# Patient Record
Sex: Female | Born: 1975 | Hispanic: Yes | Smoking: Never smoker
Health system: Southern US, Community
[De-identification: ages and names within clinical notes are randomized; demographics above are authoritative.]

---

## 2010-05-31 ENCOUNTER — Ambulatory Visit (HOSPITAL_COMMUNITY): Admission: RE | Admit: 2010-05-31 | Discharge: 2010-05-31 | Payer: Self-pay | Admitting: Obstetrics

## 2010-06-07 ENCOUNTER — Ambulatory Visit (HOSPITAL_COMMUNITY): Admission: RE | Admit: 2010-06-07 | Discharge: 2010-06-07 | Payer: Self-pay | Admitting: Obstetrics

## 2010-06-12 ENCOUNTER — Ambulatory Visit (HOSPITAL_COMMUNITY): Admission: RE | Admit: 2010-06-12 | Discharge: 2010-06-12 | Payer: Self-pay | Admitting: Obstetrics

## 2010-07-05 ENCOUNTER — Ambulatory Visit (HOSPITAL_COMMUNITY)
Admission: RE | Admit: 2010-07-05 | Discharge: 2010-07-05 | Payer: Self-pay | Source: Home / Self Care | Admitting: Obstetrics

## 2010-07-17 ENCOUNTER — Ambulatory Visit (HOSPITAL_COMMUNITY)
Admission: RE | Admit: 2010-07-17 | Discharge: 2010-07-17 | Payer: Self-pay | Source: Home / Self Care | Admitting: Obstetrics

## 2010-07-20 ENCOUNTER — Ambulatory Visit (HOSPITAL_COMMUNITY)
Admission: RE | Admit: 2010-07-20 | Discharge: 2010-07-20 | Payer: Self-pay | Source: Home / Self Care | Admitting: Obstetrics

## 2010-07-24 ENCOUNTER — Ambulatory Visit (HOSPITAL_COMMUNITY)
Admission: RE | Admit: 2010-07-24 | Discharge: 2010-07-24 | Payer: Self-pay | Source: Home / Self Care | Attending: Obstetrics | Admitting: Obstetrics

## 2010-07-26 ENCOUNTER — Ambulatory Visit (HOSPITAL_COMMUNITY)
Admission: RE | Admit: 2010-07-26 | Discharge: 2010-07-26 | Payer: Self-pay | Source: Home / Self Care | Attending: Obstetrics | Admitting: Obstetrics

## 2010-07-30 ENCOUNTER — Ambulatory Visit (HOSPITAL_COMMUNITY)
Admission: RE | Admit: 2010-07-30 | Discharge: 2010-07-30 | Payer: Self-pay | Source: Home / Self Care | Attending: Obstetrics | Admitting: Obstetrics

## 2010-08-02 ENCOUNTER — Encounter
Admission: RE | Admit: 2010-08-02 | Discharge: 2010-09-18 | Payer: Self-pay | Source: Home / Self Care | Attending: Obstetrics | Admitting: Obstetrics

## 2010-08-02 ENCOUNTER — Ambulatory Visit (HOSPITAL_COMMUNITY)
Admission: RE | Admit: 2010-08-02 | Discharge: 2010-08-02 | Payer: Self-pay | Source: Home / Self Care | Attending: Obstetrics | Admitting: Obstetrics

## 2010-08-07 ENCOUNTER — Ambulatory Visit (HOSPITAL_COMMUNITY)
Admission: RE | Admit: 2010-08-07 | Discharge: 2010-08-07 | Payer: Self-pay | Source: Home / Self Care | Attending: Obstetrics | Admitting: Obstetrics

## 2010-08-09 ENCOUNTER — Inpatient Hospital Stay (HOSPITAL_COMMUNITY)
Admission: RE | Admit: 2010-08-09 | Discharge: 2010-08-11 | Payer: Self-pay | Source: Home / Self Care | Attending: Obstetrics | Admitting: Obstetrics

## 2010-09-08 ENCOUNTER — Encounter: Payer: Self-pay | Admitting: Obstetrics

## 2010-10-29 LAB — GLUCOSE, CAPILLARY
Glucose-Capillary: 114 mg/dL — ABNORMAL HIGH (ref 70–99)
Glucose-Capillary: 82 mg/dL (ref 70–99)
Glucose-Capillary: 94 mg/dL (ref 70–99)
Glucose-Capillary: 97 mg/dL (ref 70–99)

## 2010-10-29 LAB — CBC
HCT: 30.2 % — ABNORMAL LOW (ref 36.0–46.0)
HCT: 34.3 % — ABNORMAL LOW (ref 36.0–46.0)
Hemoglobin: 11 g/dL — ABNORMAL LOW (ref 12.0–15.0)
MCH: 24.1 pg — ABNORMAL LOW (ref 26.0–34.0)
MCHC: 32.1 g/dL (ref 30.0–36.0)
MCV: 75.1 fL — ABNORMAL LOW (ref 78.0–100.0)
RBC: 4.02 MIL/uL (ref 3.87–5.11)
RBC: 4.56 MIL/uL (ref 3.87–5.11)
WBC: 9.2 10*3/uL (ref 4.0–10.5)

## 2010-10-29 LAB — RAPID HIV SCREEN (WH-MAU): Rapid HIV Screen: NONREACTIVE

## 2010-10-29 LAB — RPR: RPR Ser Ql: NONREACTIVE

## 2011-03-30 IMAGING — US US OB DETAIL+14 WK
1 series · 18 of 28 positions shown · non-contrast
Comparison: none

OBSTETRICAL ULTRASOUND:
 This ultrasound was performed in The [HOSPITAL], and the [facility] report will be stored to [REDACTED] PACS.  This report is also available in [HOSPITAL]?s accessANYware.

[Series 1: us ob detail+14 wk · 18 of 64 slices shown]
[im 1/64]
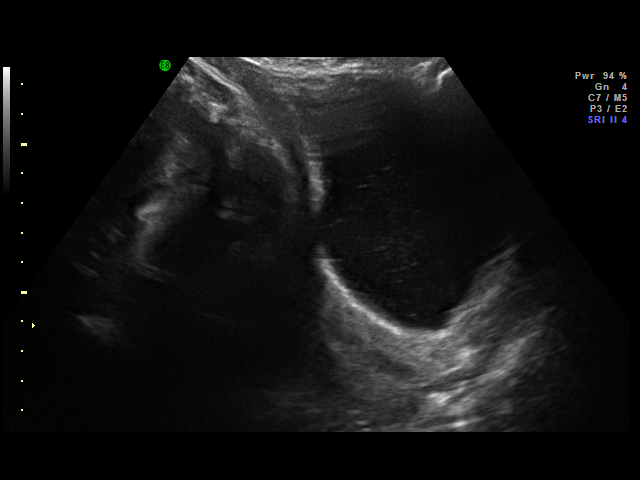
[im 5/64]
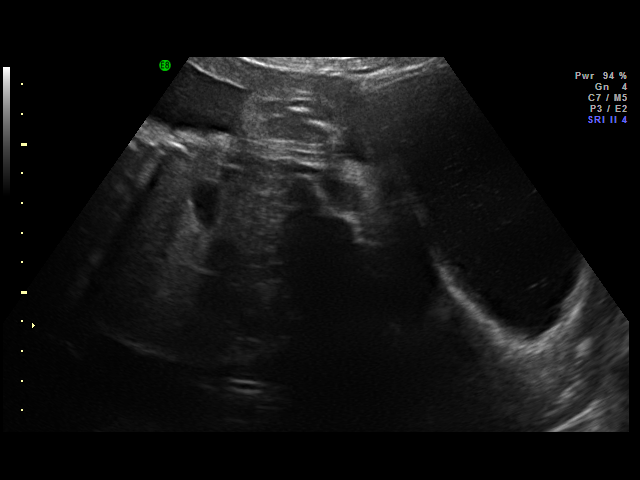
[im 8/64]
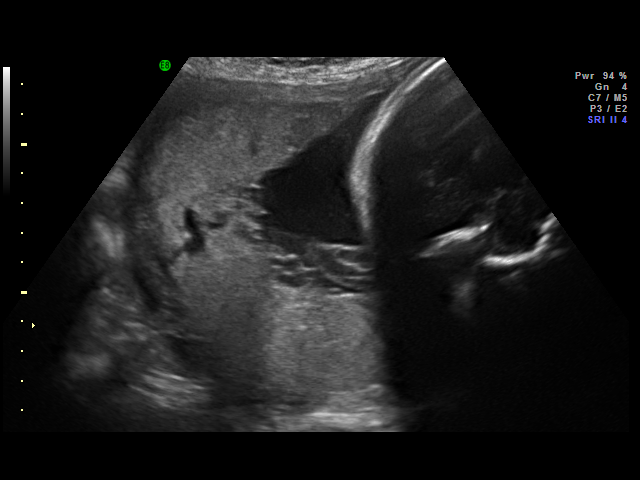
[im 12/64]
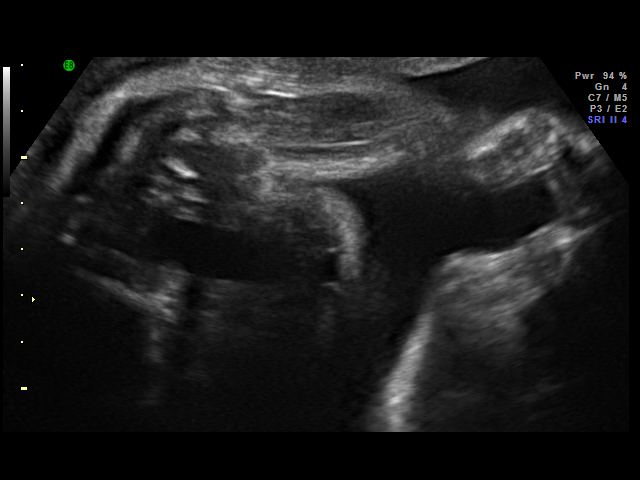
[im 17/64]
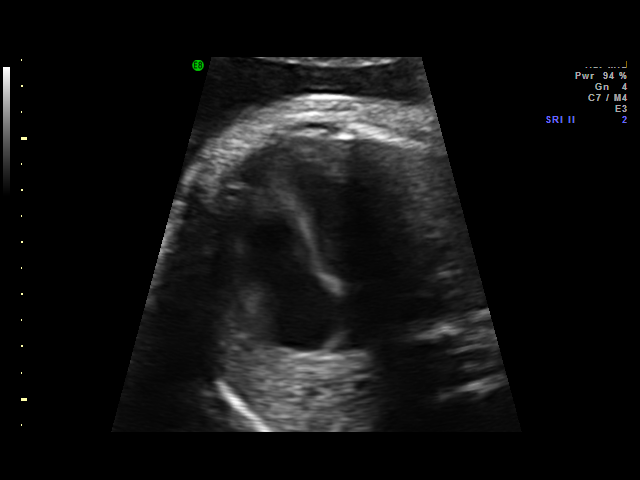
[im 19/64]
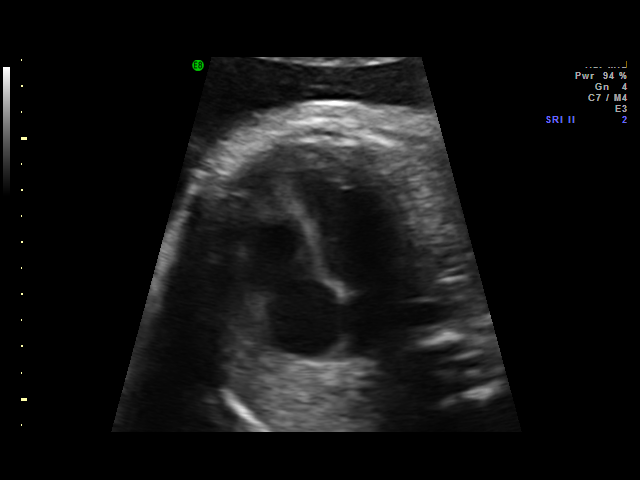
[im 24/64]
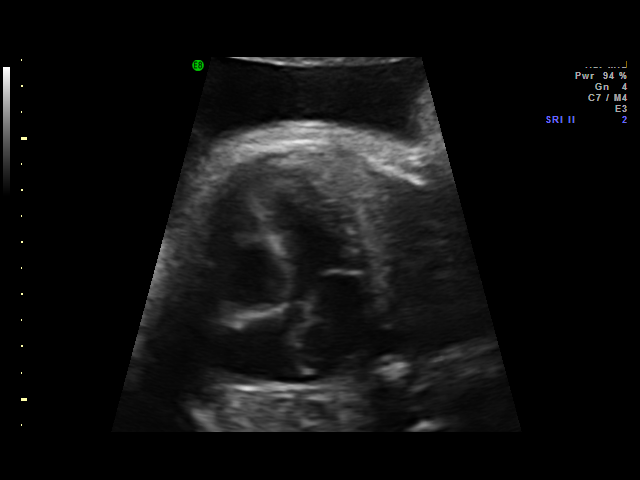
[im 26/64]
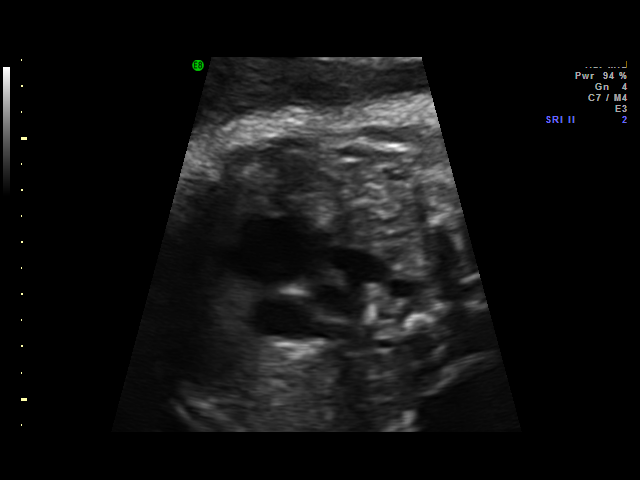
[im 31/64]
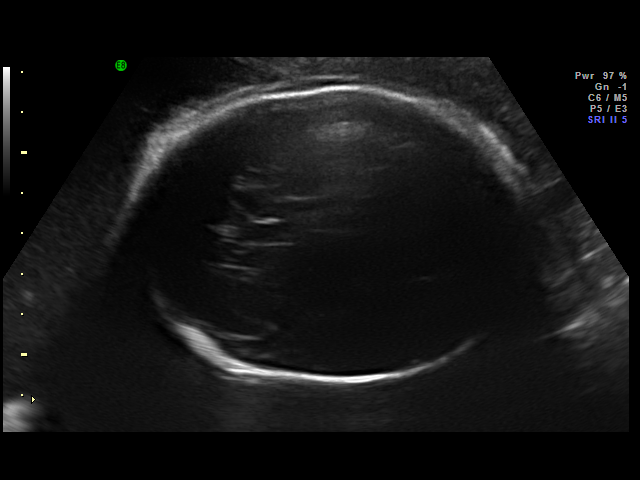
[im 33/64]
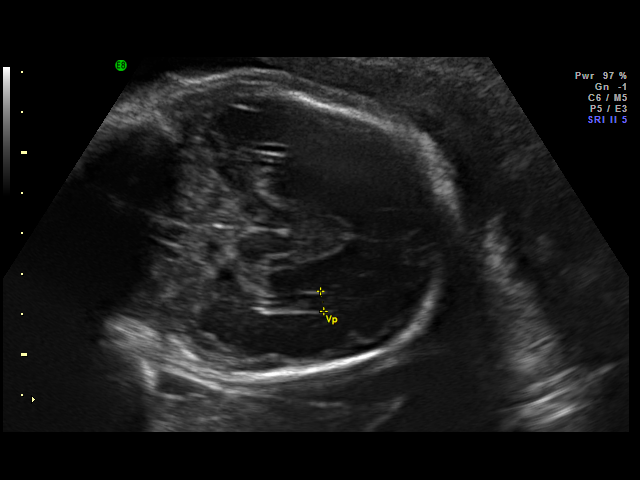
[im 38/64]
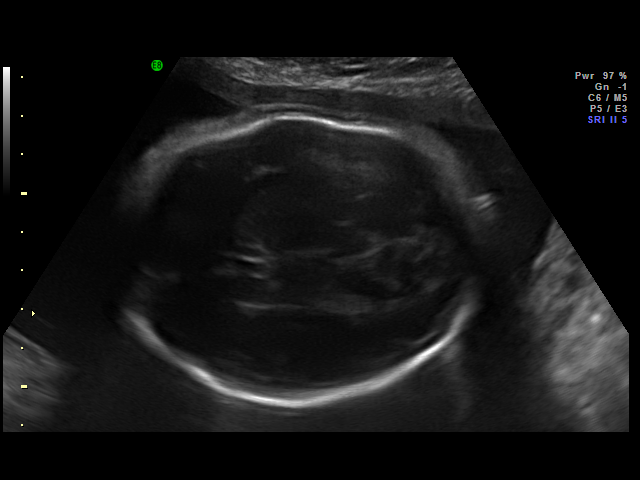
[im 40/64]
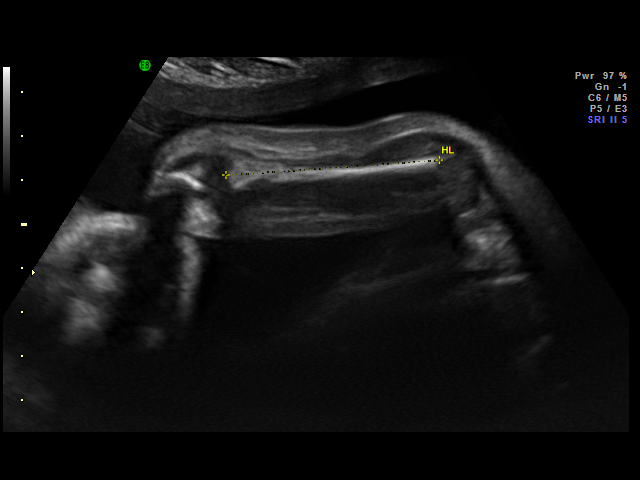
[im 45/64]
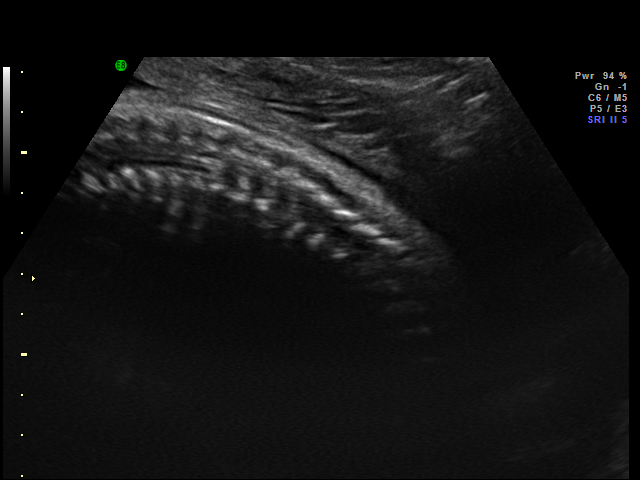
[im 50/64]
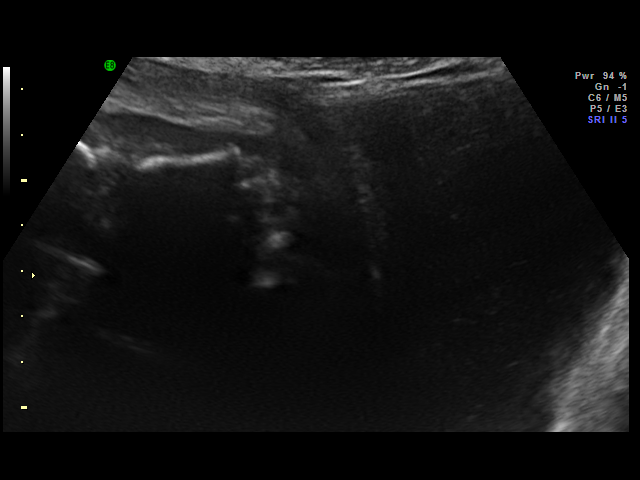
[im 52/64]
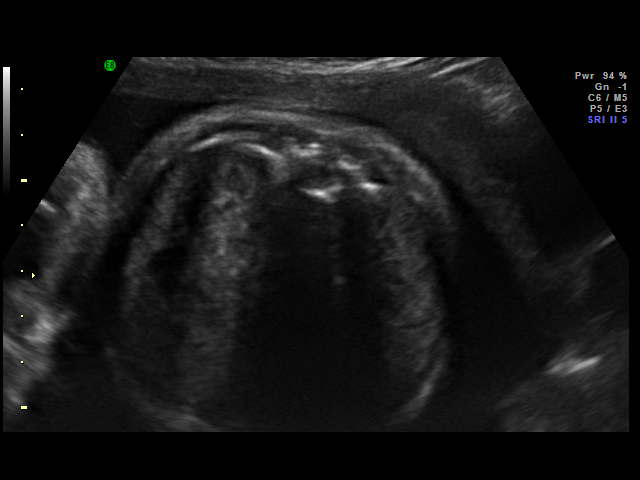
[im 57/64]
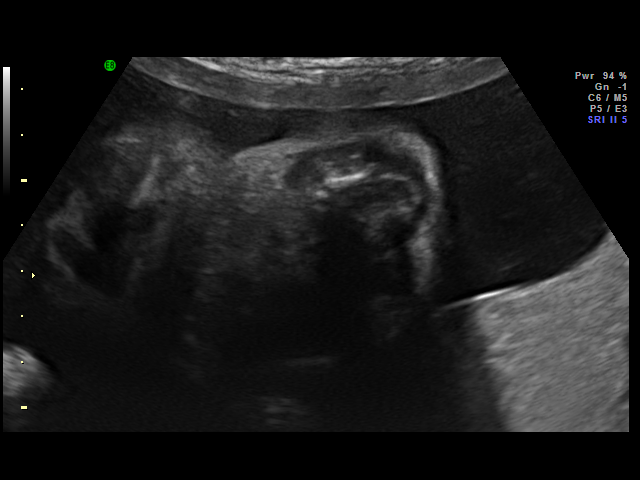
[im 59/64]
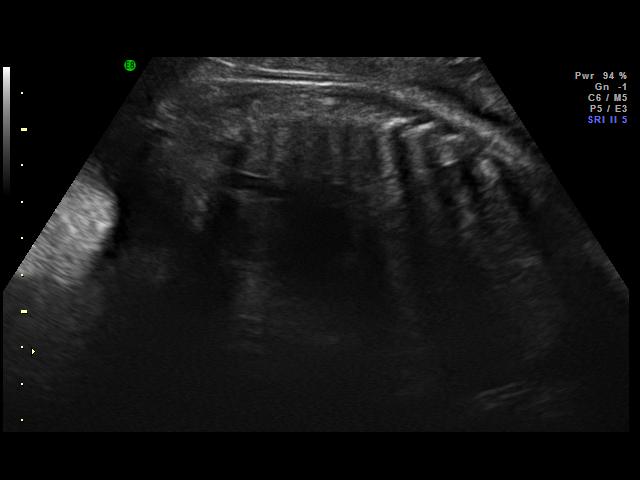
[im 64/64]
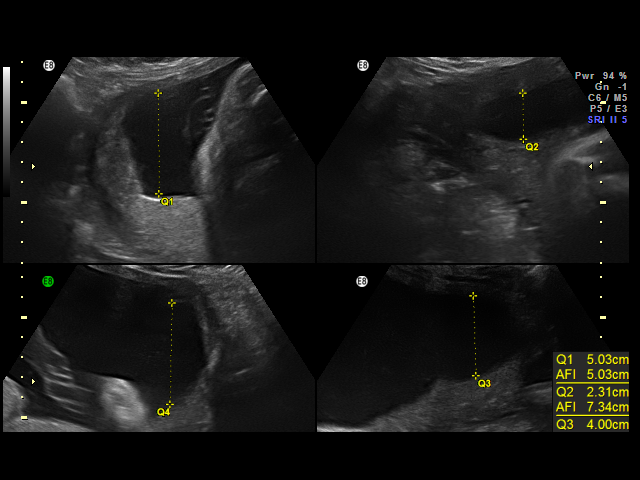

[18 of 28 positions shown; findings below may reference images not displayed]

IMPRESSION: [facility] has also been faxed to the ordering physician.

## 2011-06-01 IMAGING — US US OB FOLLOW-UP
1 series · 12 of 27 positions shown · non-contrast
Comparison: none

[Series 1: us ob follow up · 12 of 27 slices shown]
[im 2/27]
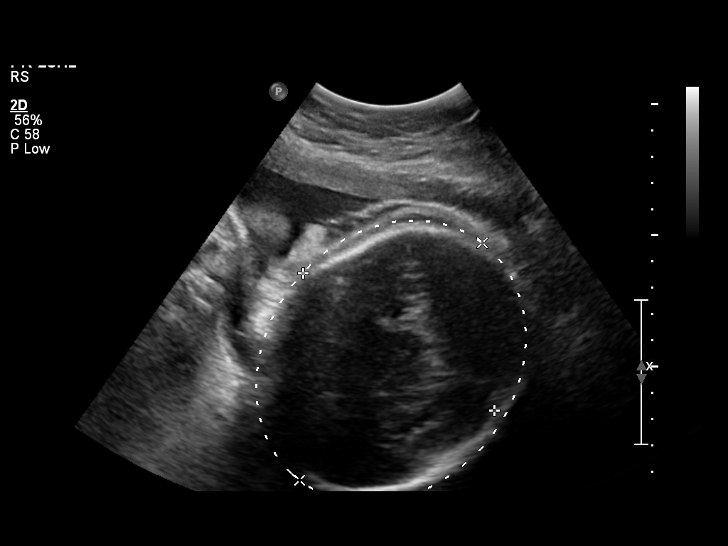
[im 4/27]
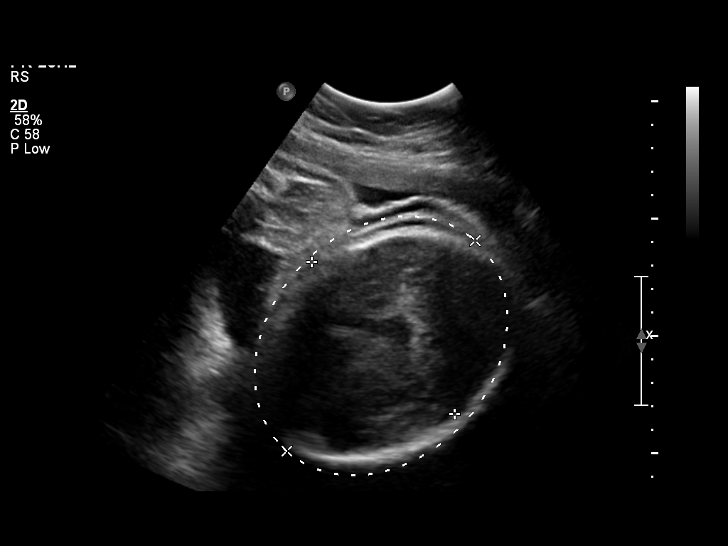
[im 6/27]
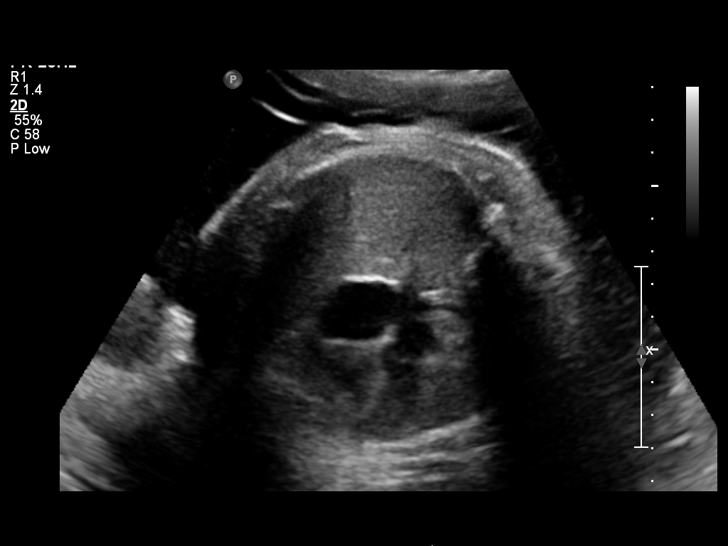
[im 8/27]
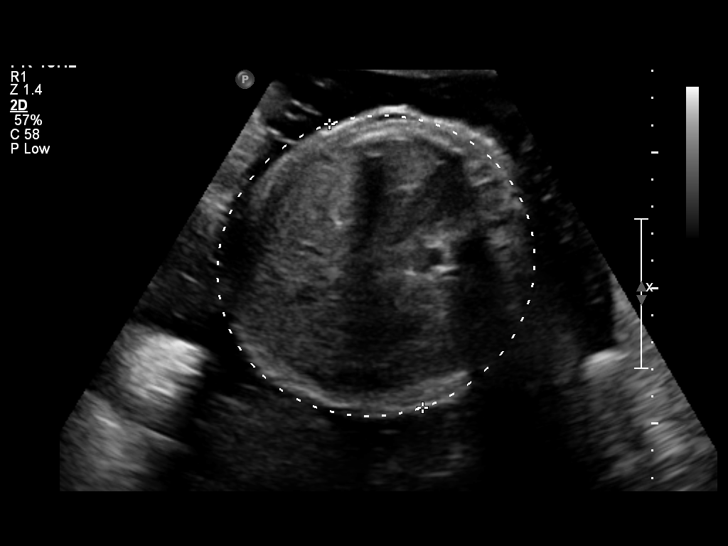
[im 11/27]
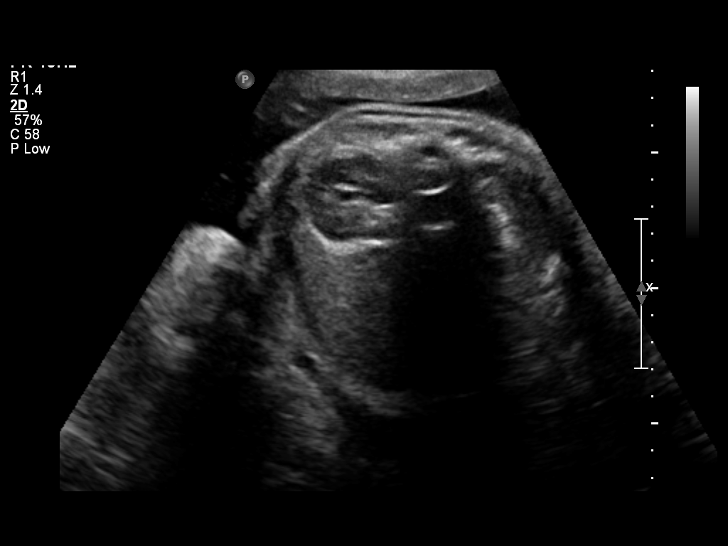
[im 13/27]
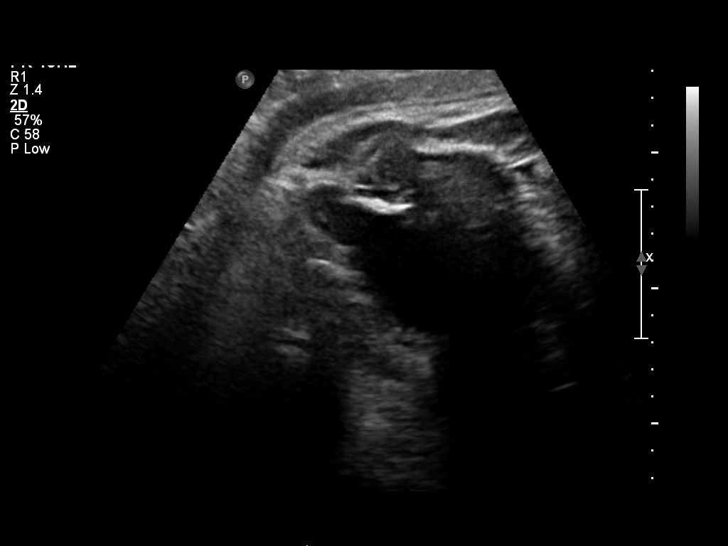
[im 15/27]
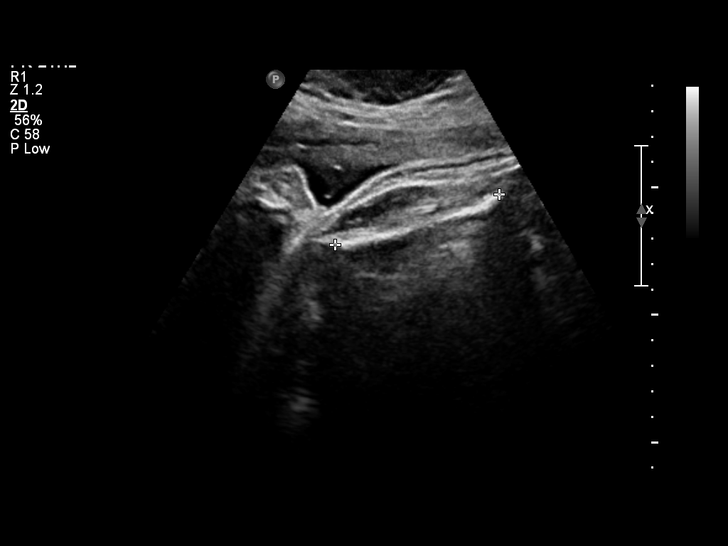
[im 17/27]
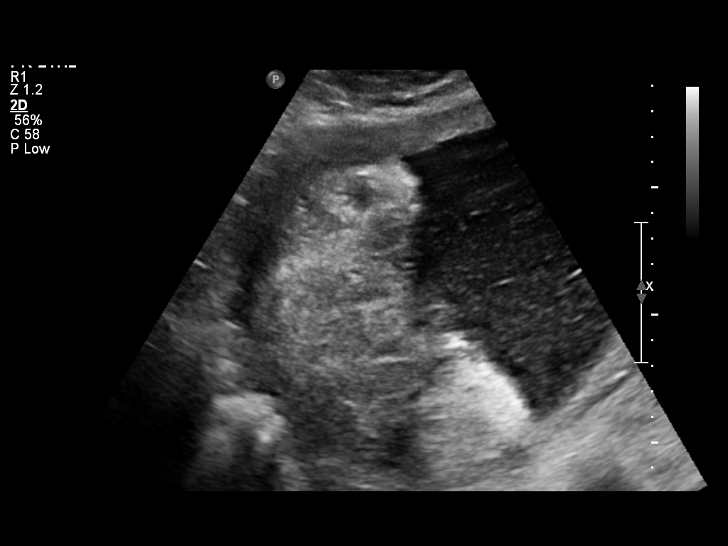
[im 20/27]
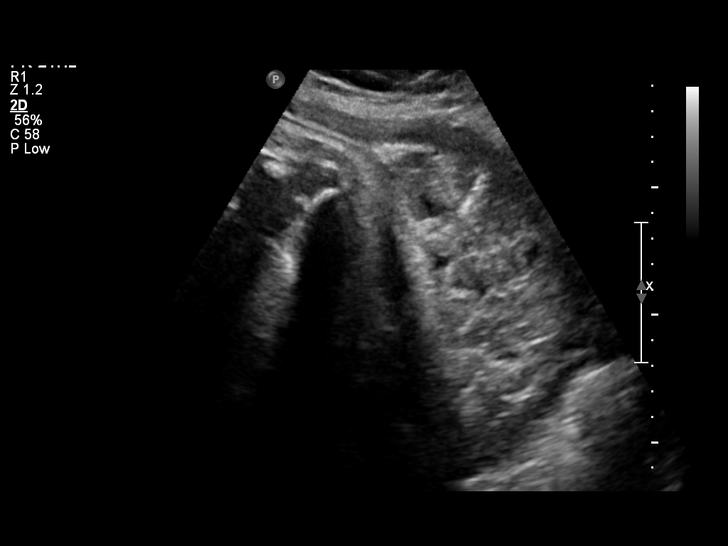
[im 22/27]
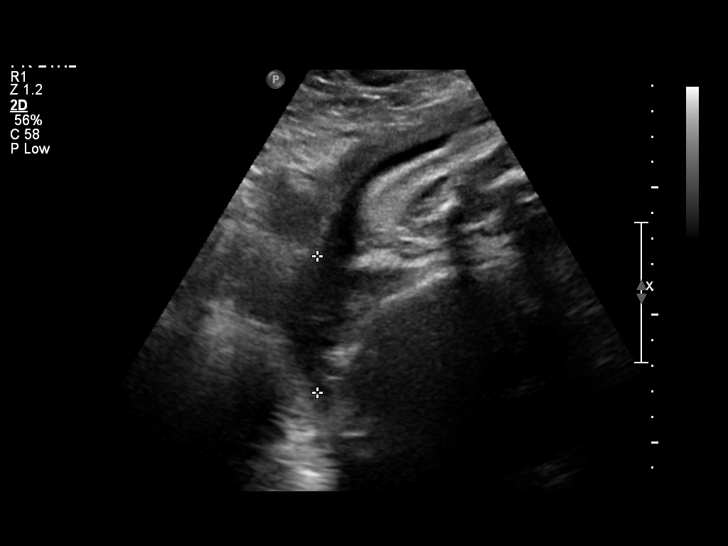
[im 24/27]
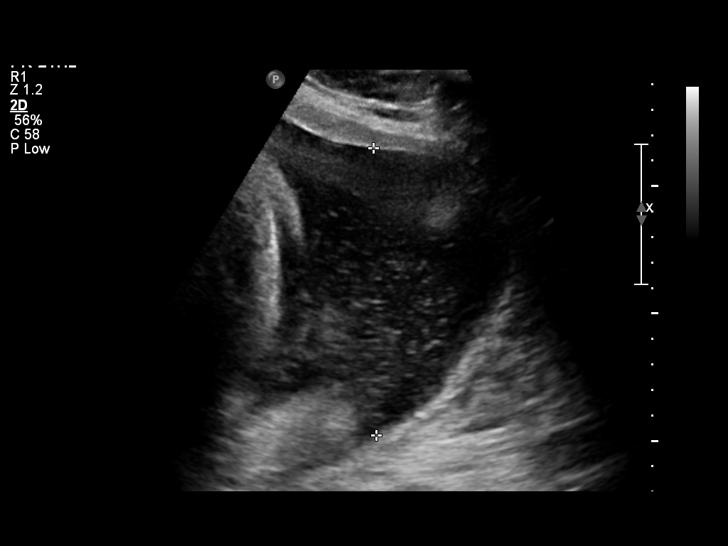
[im 26/27]
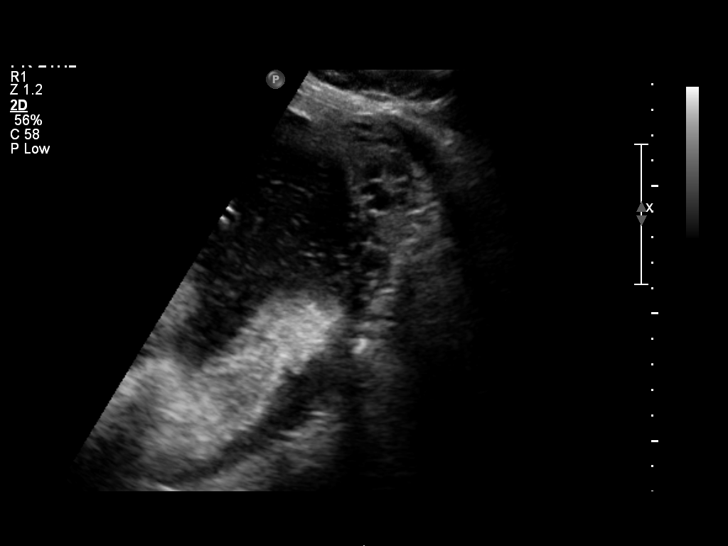

[12 of 27 positions shown; findings below may reference images not displayed]

OBSTETRICS REPORT
                      (Signed Final 08/09/2010 [DATE])

 Order#:         _I
Procedures

 US OB FOLLOW UP                                       76816.1
Indications

 Uncertain fetal presentation
 Assess Fetal Growth / Estimated Fetal Weight
 Unilateral fetal renal agenesis
 A2 Diabetes (currently treated with insulin)
 Mild polyhydramnios
 Increased BMI
 Late entry to prenatal care
 Prior macrosomic infant (10 lbs)
Fetal Evaluation

 Fetal Heart Rate:  135                          bpm
 Cardiac Activity:  Observed
 Presentation:      Cephalic
 Placenta:          Posterior, above cervical
                    os
 P. Cord            Previously Visualized
 Insertion:

 Amniotic Fluid
 AFI FV:      Subjectively increased
 AFI Sum:     20.96   cm       88  %Tile     Larg Pckt:  11.22   cm
 RUQ:   2.08    cm   RLQ:    11.22  cm    LUQ:   2.33    cm   LLQ:    5.33   cm
Biometry

 BPD:     89.9  mm     G. Age:  36w 3d                CI:        71.73   70 - 86
                                                      FL/HC:      20.5   20.6 -

 HC:     337.9  mm     G. Age:  38w 5d       33  %    HC/AC:      0.93   0.87 -

 AC:     362.2  mm     G. Age:  40w 1d       92  %    FL/BPD:     77.0   71 - 87
 FL:      69.2  mm     G. Age:  35w 4d      < 3  %    FL/AC:      19.1   20 - 24

 Est. FW:    6333  gm    7 lb 12 oz      78  %
Gestational Age
 LMP:           37w 6d        Date:  11/17/09                 EDD:   08/24/10
 Clinical EDD:  36w 3d                                        EDD:   09/03/10
 U/S Today:     37w 5d                                        EDD:   08/25/10
 Best:          39w 0d     Det. By:  U/S    (06/07/10)        EDD:   08/16/10
Anatomy

 Cranium:           Appears normal      Aortic Arch:       Not well
                                                           visualized
 Fetal Cavum:       Not well            Ductal Arch:       Not well
                    visualized                             visualized
 Ventricles:        Not well            Diaphragm:         Previously seen
                    visualized
 Choroid Plexus:    Previously seen     Stomach:           Appears
                                                           normal, left
                                                           sided
 Cerebellum:        Previously seen     Abdomen:           Appears normal
 Posterior Fossa:   Previously seen     Abdominal Wall:    Not well
                                                           visualized
 Nuchal Fold:       Not applicable      Cord Vessels:      Previously seen
                    (>20 wks GA)
 Face:              Previously seen     Kidneys:           Rt appears
                                                           normal; left
                                                           absent
 Heart:             Not well            Bladder:           Appears normal
                    visualized
 RVOT:              Previously seen     Spine:             Previously seen
 LVOT:              Previously seen     Limbs:             Previously seen

 Other:     Technically difficult due to advanced gestational age,
            fetal position, and maternal body habitus.
Cervix Uterus Adnexa

 Cervix:       Not evaluated.
 Uterus:       No abnormality visualized.
 Cul De Sac:   No free fluid seen.

 Left Ovary:    Not visualized.
 Right Ovary:   Not visualized.
 Adnexa:     No abnormality visualized.
Impression

 Assigned GA is currently 39w 0d.   EFW currently at 78 %ile.
 Amniotic fluid volume is subjectively increased, with AFI of
 20.96 cm (88%ile).
 Unilateral fetal renal agenesis as previously noted.

## 2011-07-29 ENCOUNTER — Emergency Department (HOSPITAL_COMMUNITY)
Admission: EM | Admit: 2011-07-29 | Discharge: 2011-07-30 | Disposition: A | Discharge status: Home / Self Care | Payer: Self-pay | Attending: Emergency Medicine | Admitting: Emergency Medicine

## 2011-07-29 DIAGNOSIS — G8918 Other acute postprocedural pain: Secondary | ICD-10-CM | POA: Insufficient documentation

## 2011-07-29 DIAGNOSIS — K089 Disorder of teeth and supporting structures, unspecified: Secondary | ICD-10-CM | POA: Insufficient documentation

## 2011-07-29 DIAGNOSIS — E119 Type 2 diabetes mellitus without complications: Secondary | ICD-10-CM | POA: Insufficient documentation

## 2011-07-29 DIAGNOSIS — IMO0002 Reserved for concepts with insufficient information to code with codable children: Secondary | ICD-10-CM | POA: Insufficient documentation

## 2011-07-29 DIAGNOSIS — K029 Dental caries, unspecified: Secondary | ICD-10-CM | POA: Insufficient documentation

## 2011-07-30 ENCOUNTER — Encounter: Payer: Self-pay | Admitting: Emergency Medicine

## 2011-07-30 MED ORDER — OXYCODONE-ACETAMINOPHEN 5-325 MG PO TABS: 2.0000 | ORAL_TABLET | ORAL | Status: AC | PRN

## 2011-07-30 NOTE — ED Provider Notes (Signed)
History     CSN: [id number] Arrival date & time: 07/29/2011 11:51 PM   First MD Initiated Contact with Patient 07/30/11 0207      Chief Complaint  Patient presents with  . Dental Pain    (Consider location/radiation/quality/duration/timing/severity/associated sxs/prior treatment) HPI 35 year old female presents to emergency department complaining of right upper molar pain since extraction earlier today. Patient reports she was sent home for the dentist office on 800 mg of Motrin. Patient is requesting something stronger as the Motrin is not covering her pain. She is also concerned about possible infection. She denies any fever or chills no drainage of pus. No swelling of the face. Past Medical History  Diagnosis Date  . Diabetes mellitus     History reviewed. No pertinent past surgical history.  No family history on file.  History  Substance Use Topics  . Smoking status: Never Smoker   . Smokeless tobacco: Not on file  . Alcohol Use: No    OB History    Grav Para Term Preterm Abortions TAB SAB Ect Mult Living                  Review of Systems  Unable to perform ROS: Other   language barrier, used interpreter phone which was of poor quality  Allergies  Review of patient's allergies indicates no known allergies.  Home Medications   Current Outpatient Rx  Name Route Sig Dispense Refill  . IBUPROFEN 800 MG PO TABS Oral Take 800 mg by mouth 3 (three) times daily. Patient takes everyday per patient.     . OXYCODONE-ACETAMINOPHEN 5-325 MG PO TABS Oral Take 2 tablets by mouth every 4 (four) hours as needed for pain. 20 tablet 0    BP 116/80  Pulse 86  Temp(Src) 98.1 F (36.7 C) (Oral)  Resp 14  SpO2 98%  LMP 07/20/2011  Physical Exam  HENT:  Head: Normocephalic and atraumatic.  Right Ear: External ear normal.  Left Ear: External ear normal.       Right upper first molar extraction site noted. Patient with multiple sites of dental caries and receding gums.  No acute infection noted  Neck: No JVD present. No tracheal deviation present. No thyromegaly present.  Cardiovascular: Normal rate, regular rhythm and normal heart sounds.   Pulmonary/Chest: Effort normal and breath sounds normal. No stridor. No respiratory distress. She has no wheezes. She has no rales. She exhibits no tenderness.  Abdominal: Soft. Bowel sounds are normal.  Musculoskeletal: Normal range of motion.  Lymphadenopathy:    She has no cervical adenopathy.  Neurological: She is alert. She exhibits normal muscle tone. Coordination normal.  Skin: Skin is warm and dry. No rash noted. No erythema. No pallor.  Psychiatric: She has a normal mood and affect. Her behavior is normal. Judgment and thought content normal.    ED Course  Procedures (including critical care time)  Labs Reviewed - No data to display No results found.   1. Post-op pain   2. Dental disorder       MDM  35 year old female status post tooth extraction with postop pain. Patient only given Motrin, will add on Percocet. No signs of acute infection. Patient to followup with her oral surgeon/dentist        Olivia Mackie, MD 07/30/11 [phone]

## 2011-07-30 NOTE — ED Notes (Signed)
PT. REPORTS RIGHT UPPER MOLAR PAIN X2 DAYS . DAUGHTER TRANSLATING FOR PT.

## 2012-11-09 ENCOUNTER — Emergency Department (HOSPITAL_COMMUNITY)
Admission: EM | Admit: 2012-11-09 | Discharge: 2012-11-09 | Disposition: A | Discharge status: Home / Self Care | Payer: Self-pay | Attending: Emergency Medicine | Admitting: Emergency Medicine

## 2012-11-09 ENCOUNTER — Encounter (HOSPITAL_COMMUNITY): Payer: Self-pay | Admitting: Emergency Medicine

## 2012-11-09 DIAGNOSIS — K612 Anorectal abscess: Secondary | ICD-10-CM | POA: Insufficient documentation

## 2012-11-09 DIAGNOSIS — L539 Erythematous condition, unspecified: Secondary | ICD-10-CM | POA: Insufficient documentation

## 2012-11-09 DIAGNOSIS — E119 Type 2 diabetes mellitus without complications: Secondary | ICD-10-CM | POA: Insufficient documentation

## 2012-11-09 DIAGNOSIS — L0390 Cellulitis, unspecified: Secondary | ICD-10-CM

## 2012-11-09 LAB — GLUCOSE, CAPILLARY
Glucose-Capillary: 312 mg/dL — ABNORMAL HIGH (ref 70–99)
Glucose-Capillary: 203 mg/dL — ABNORMAL HIGH (ref 70–99)

## 2012-11-09 MED ORDER — SODIUM CHLORIDE 0.9 % IV SOLN
Freq: Once | INTRAVENOUS | Status: AC
  Administered 2012-11-09: 16:00:00 via INTRAVENOUS

## 2012-11-09 MED ORDER — CEPHALEXIN 250 MG PO CAPS: 250.0000 mg | ORAL_CAPSULE | Freq: Four times a day (QID) | ORAL | Status: DC

## 2012-11-09 MED ORDER — SULFAMETHOXAZOLE-TRIMETHOPRIM 800-160 MG PO TABS: 1.0000 | ORAL_TABLET | Freq: Two times a day (BID) | ORAL | Status: DC

## 2012-11-09 NOTE — ED Notes (Signed)
Pt has abcess on her rt buttock that is painful.  Pt speaks very little english.  Pt also came to ED with CGB 277. Denies taking insulin at home says she used to take.  Pt alert oriented X4

## 2012-11-09 NOTE — ED Provider Notes (Signed)
Medical screening examination/treatment/procedure(s) were conducted as a shared visit with non-physician practitioner(s) and myself.  I personally evaluated the patient during the encounter.  Chaperone present for examination, right buttocks near gluteal fold as area of mild erythema warmth induration without fluctuance without discrete abscess and limited bedside ultrasound reveals no obvious focal fluid collection sort this point there is no obvious indication for incision and drainage although the patient is aware she may develop a discrete abscess which needs to be drained in the future.  Hurman Horn, MD 11/13/12 2046

## 2012-11-09 NOTE — ED Notes (Signed)
POCT CBG resulted 277

## 2012-11-09 NOTE — ED Provider Notes (Signed)
History     CSN: [id number]  Arrival date & time 11/09/12  1043   First MD Initiated Contact with Patient 11/09/12 1253      Chief Complaint  Patient presents with  . Wound Infection    (Consider location/radiation/quality/duration/timing/severity/associated sxs/prior treatment) Patient is a 37 y.o. female presenting with abscess. The history is provided by the patient. No language interpreter was used.  Abscess Location:  Ano-genital Ano-genital abscess location:  Gluteal cleft Size:  3cm Abscess quality: induration, painful and warmth   Abscess quality: not draining, no fluctuance, no itching, no redness and not weeping   Red streaking: no   Duration:  2 days Progression:  Worsening Pain details:    Quality:  Throbbing   Severity:  Severe   Duration:  2 days   Timing:  Constant   Progression:  Worsening Chronicity:  New Context: diabetes   Relieved by:  Nothing Ineffective treatments:  None tried Associated symptoms: no fever, no nausea and no vomiting     Past Medical History  Diagnosis Date  . Diabetes mellitus     History reviewed. No pertinent past surgical history.  No family history on file.  History  Substance Use Topics  . Smoking status: Never Smoker   . Smokeless tobacco: Not on file  . Alcohol Use: No    OB History   Grav Para Term Preterm Abortions TAB SAB Ect Mult Living                  Review of Systems  Constitutional: Negative for fever.  Respiratory: Negative for shortness of breath.   Cardiovascular: Negative for chest pain.  Gastrointestinal: Negative for nausea and vomiting.  Hematological: Negative for adenopathy.  All other systems reviewed and are negative.    Allergies  Review of patient's allergies indicates no known allergies.  Home Medications  No current outpatient prescriptions on file.  BP 139/74  Pulse 103  Temp(Src) 98.3 F (36.8 C) (Oral)  Resp 18  SpO2 99%  Physical Exam  Nursing note and vitals  reviewed. Constitutional: She is oriented to person, place, and time. Vital signs are normal. She appears well-developed and well-nourished. No distress.  HENT:  Head: Normocephalic and atraumatic.  Right Ear: External ear normal.  Left Ear: External ear normal.  Nose: Nose normal.  Eyes: Conjunctivae and lids are normal.  Neck: No tracheal deviation present.  Cardiovascular: Normal rate and regular rhythm.  Exam reveals no gallop and no friction rub.   No murmur heard. Pulmonary/Chest: Effort normal and breath sounds normal. No stridor.  Abdominal: Soft. She exhibits no distension. There is no tenderness.  Musculoskeletal: Normal range of motion.  Neurological: She is alert and oriented to person, place, and time.  Skin: Skin is warm. She is not diaphoretic.  3 cm area of induration - mild erythema, warmth; no fluctuance, streaking in gluteal cleft  Psychiatric: She has a normal mood and affect. Her behavior is normal.    ED Course  Procedures (including critical care time)  Labs Reviewed  GLUCOSE, CAPILLARY - Abnormal; Notable for the following:    Glucose-Capillary 277 (*)    All other components within normal limits  GLUCOSE, CAPILLARY - Abnormal; Notable for the following:    Glucose-Capillary 312 (*)    All other components within normal limits  GLUCOSE, CAPILLARY - Abnormal; Notable for the following:    Glucose-Capillary 203 (*)    All other components within normal limits   No results found.  1. Cellulitis   2. Diabetes mellitus       MDM  Patient presented today for a painful area on her buttocks. It is a 3cm area of induration without fluctuance. US done - no obvious fluid collection noted. Discussed with the patient that I&D is not able to be performed today. Given abx to cover cellulitis and discussed the likelihood that it will need to be drained in a few days. Return precautions given. Patient had a CBG reading of 312. 1 L bolus of NS brought BS down to  203. Follow up with PCP for better management of DM. Patient / Family / Caregiver informed of clinical course, understand medical decision-making process, and agree with plan.      Mora Bellman, PA-C 11/10/12 [phone]

## 2012-11-09 NOTE — ED Notes (Signed)
Bump on her bottom that is red x 2 days hurts to sit

## 2012-11-10 NOTE — ED Provider Notes (Signed)
Medical screening examination/treatment/procedure(s) were performed by non-physician practitioner and as supervising physician I was immediately available for consultation/collaboration.   Matilda Fleig E Stephanye Finnicum, MD 11/10/12 1542 

## 2012-11-11 ENCOUNTER — Ambulatory Visit: Payer: Self-pay | Admitting: Physician Assistant

## 2012-11-11 VITALS — BP 127/72 | HR 109 | Temp 101.5°F | Resp 20 | Ht 63.5 in | Wt 183.2 lb

## 2012-11-11 DIAGNOSIS — L03317 Cellulitis of buttock: Secondary | ICD-10-CM

## 2012-11-11 DIAGNOSIS — R509 Fever, unspecified: Secondary | ICD-10-CM

## 2012-11-11 DIAGNOSIS — L0231 Cutaneous abscess of buttock: Secondary | ICD-10-CM

## 2012-11-11 DIAGNOSIS — E119 Type 2 diabetes mellitus without complications: Secondary | ICD-10-CM

## 2012-11-11 LAB — POCT CBC
Granulocyte percent: 75.8 %G (ref 37–80)
HCT, POC: 39.5 % (ref 37.7–47.9)
MCH, POC: 27.5 pg (ref 27–31.2)
MCV: 85.4 fL (ref 80–97)
MID (cbc): 0.6 (ref 0–0.9)
POC LYMPH PERCENT: 18.3 %L (ref 10–50)
Platelet Count, POC: 272 10*3/uL (ref 142–424)
RBC: 4.62 M/uL (ref 4.04–5.48)
RDW, POC: 14.1 %
WBC: 10.6 10*3/uL — AB (ref 4.6–10.2)

## 2012-11-11 LAB — POCT GLYCOSYLATED HEMOGLOBIN (HGB A1C): Hemoglobin A1C: 13

## 2012-11-11 MED ORDER — SULFAMETHOXAZOLE-TRIMETHOPRIM 800-160 MG PO TABS: 1.0000 | ORAL_TABLET | Freq: Two times a day (BID) | ORAL | Status: DC

## 2012-11-11 MED ORDER — METFORMIN HCL 500 MG PO TABS: 500.0000 mg | ORAL_TABLET | Freq: Two times a day (BID) | ORAL | Status: AC

## 2012-11-11 NOTE — Patient Instructions (Signed)
Keep taking the medicine that you got in the emergency department  Start taking the Bactrim antibiotic twice daily  Start taking metformin for your diabetes - this is very important!  Come back TOMORROW so we can recheck you!  Absceso (Abscess)  Un absceso es una zona infectada que contiene pus y desechos.Puede aparecer en cualquier parte del cuerpo. Tambin se lo conoce como fornculo o divieso. CAUSAS  Ocurre cuando los tejidos se infectan. Tambin puede formarse por obstruccin de las glndulas sebceas o las glndulas sudorparas, infeccin de los folculos pilosos o por una lesin pequea en la piel. A medida que el organismo lucha contra la infeccin, se acumula pus en la zona y hace presin debajo de la piel. Esta presin causa dolor. Las personas con un sistema inmunolgico debilitado tienen dificultad para Industrial/product designer las infecciones y pueden formar abscesos con ms frecuencia.  SNTOMAS  Generalmente un absceso se forma sobre la piel y se vuelve una masa dolorosa, roja, caliente y sensible. Si se forma debajo de la piel, podr sentir como una zona blanda, que se [place], debajo de la piel. Algunos abscesos se abren (ruptura) por s mismos, pero la mayora seguir empeorando si no se lo trata. La infeccin puede diseminarse hacia otros sitios del cuerpo y finalmente al torrente sanguneo y hace que el enfermo se sienta mal.  DIAGNSTICO  El mdico le har una historia clnica y un examen fsico. Podrn tomarle Lauris Poag de lquido del absceso y Public librarian para Clinical research associate la causa de la infeccin. .  TRATAMIENTO  El mdico le indicar antibiticos para combatir la infeccin. Sin embargo, el uso de antibiticos solamente no curar el absceso. El mdico tendr que hacer un pequeo corte (incisin) en el absceso para drenar el pus. En algunos casos se introduce una gasa en el absceso para reducir Chief Technology Officer y que siga drenando la zona.  INSTRUCCIONES PARA EL CUIDADO EN EL HOGAR   Solo tome  medicamentos de venta libre o recetados para Chief Technology Officer, Dentist o fiebre, segn las indicaciones del mdico.  Si le han recetado antibiticos, tmelos segn las indicaciones. Tmelos todos, aunque se sienta mejor.  Si le aplicaron una gasa, siga las indicaciones del mdico para [place].  Para evitar la propagacin de la infeccin:  Mantenga el absceso cubierto con el vendaje.  Lvese bien las manos.  No comparta artculos de cuidado personal, toallas o jacuzzis con los dems.  Evite el contacto con la piel de Economist.  Mantenga la piel y la ropa limpia alrededor del absceso.  Cumpla con todas las visitas de control, segn le indique su mdico. SOLICITE ATENCIN MDICA SI:   Aumenta el dolor, la hinchazn, el enrojecimiento, drena lquido o sangra.  Siente dolores musculares, escalofros, o una sensacin general de Dentist.  Tiene fiebre. ASEGRESE DE QUE:   Comprende estas instrucciones.  Controlar su enfermedad.  Solicitar ayuda de inmediato si no mejora o si empeora. Document Released: 08/05/2005 Document Revised: 02/04/2012 [facility] Patient Information 2013 [place], [place].   Diabetes, preguntas ms frecuentes (Diabetes, Frequently Asked Questions) QU ES LA DIABETES? La mayor parte de los alimentos que consumimos se transforman en glucosa (azcar), que es utilizada por el cuerpo para Engineer, structural. El pncreas, un rgano que se encuentra cerca del [place], produce una hormona llamada insulina para facilitar el transporte de la glucosa hacia el interior de las clulas del organismo. Cuando se sufre de diabetes, el organismo no produce suficiente insulina o no puede utilizarla adecuadamente. Esto hace que  el azcar se acumule en la sangre. CULES SON LOS SNTOMAS DE LA DIABETES?  Necesidad frecuente de Geographical information systems officer  Sed excesiva.  Prdida de peso sin causa aparente.  Hambre excesiva.  Visin borrosa.  Hormigueo o adormecimiento de las manos y los  pies.  Cansancio extremo la mayor parte del [place].  Piel seca y que pica.  lceras que tardan mucho en curarse.  Infeccin por hongos. CULES SON LOS TIPOS DE DIABETES? Diabetes tipo 1  Aproximadamente un 10% de las personas afectadas sufren este tipo de diabetes.  Suele aparecer [employer] 30 aos.  Suele ocurrir Solicitor con peso normal. Diabetes tipo 2  Aproximadamente un 90% de las personas afectadas sufren este tipo de diabetes.  Suele aparecer despus de los 40 aos.  Suele ocurrir Solicitor con sobrepeso.  Ms probabilidades si tiene:  Antecedentes familiares de diabetes.  Historial de diabetes durante el embarazo (diabetes gestacional).  Hipertensin arterial.  Colesterol alto y triglicridos. Diabetes gestacional  Se presenta en el 4% de los embarazos.  Por lo general desaparece una vez que ha nacido el beb.  Suele ocurrir en mujeres con:  Antecedentes familiares de diabetes.  Diabetes gestacional previa.  Obesidad.  Mayores de 25 aos. QU ES LA PRE-DIABETES? Pre-diabetes significa que su nivel de glucosa en sangre es mayor que lo normal, pero no lo suficiente como para diagnosticar diabetes. Tambin significa que usted est en riesgo de padecer diabetes tipo 2 y enfermedad cardaca. Si se le diagnostica pre-diabetes, deber controlarse la glucosa en sangre nuevamente dentro de 1 o 2 aos. CMO SE REALIZA EL TRATAMIENTO PARA LA DIABETES?  El tratamiento est dirigido a Naval architect de Medical illustrator (International aid/development worker) de la sangre cerca de los valores normales en todo momento. En el tratamiento de la diabetes, es muy importante el entrenamiento para el autocontrol de la enfermedad. Segn el tipo de diabetes que tenga, su tratamiento incluir uno o ms de las indicaciones siguientes :  Control de Education officer, museum.  Planificacin de los alimentos.  Prctica de ejercicios.  Medicamentos por va oral (pldoras) o insulina. PUEDE PREVENIRSE LA  DIABETES? En la diabetes de tipo 1, la prevencin es ms difcil, debido a que no se conoce cules pueden ser los disparadores En la diabetes tipo 2, la prevencin es ms fcil, si realiza cambios en el estilo de vida:  Mantener un Runner, broadcasting/film/video adecuado.  Comer sano.  La prctica de ejercicios. EXISTE UNA CURA PARA LA DIABETES? No hay cura para la diabetes. Se investiga de manera continua en bsqueda de [place], y se han realizado progresos. Este trastorno puede tratarse y Tour manager. Las personas con diabetes pueden controlarla y llevar una vida normal y [place]. DEBERA HACERME UN CONTROL PARA LA DIABETES? Si tiene ms de 45 aos, debe realizarse un control de diabetes. Deber volver a Geologist, engineering cada 3 aos. Si tiene 45 aos o ms y tiene sobrepeso es muy recomendable que se realice un control con mayor frecuencia. Si tiene menos de [address], sobrepeso, y tiene uno o ms factores de riesgo, deber considerar:  Antecedentes familiares de diabetes.  Estilo de vida sedentario  Hipertensin arterial. EXISTEN OTRAS FUENTES DE INFORMACIN DE LA DIABETES?  Las siguientes organizaciones pueden resultarle tiles en su bsqueda de informacin sobre la diabetes: [employer] Diabetes Education Program (Programa Nacional de Educacin sobre la Diabetes) Internet: SolarDiscussions.es American Association of Diabetes Educators (Asociacin Norteamericana de Educadores para la Diabetes) Internet: http://www.aadenet.org Juvenile Diabetes Foundation International (Fundacin Internacional para la  Diabetes Juvenil) Internet: WetlessWash.is Document Released: 08/05/2005 Document Revised: 10/28/2011 [facility] Patient Information 2013 [place], [place].

## 2012-11-11 NOTE — Progress Notes (Signed)
Subjective:    Patient ID: Gloria Roy, female    DOB: 07/10/1976, 37 y.o.   MRN: [id number]  HPI   Gloria Roy is a 37 yr old female here with concern for an abscess of the right buttock.  This has been going on for 5 days.  She was seen in the ED two days ago.  At that time the abscess was not ready to be drained.  ED note reviewed.  Was ordered Keflex and Bactrim, but states she only received the Keflex.  Has been taking this and tolerating it well.  She is experiencing considerable pain in the buttock.  Also febrile to 101.81F.  She has had abscesses before, last was about a year ago.    In the ED pt's BG was 300+.  Pt states she knows she has diabetes but does not take medicine.  States she cannot afford the medicine.     Review of Systems  Constitutional: Positive for fever.  HENT: Negative.   Respiratory: Negative.   Cardiovascular: Negative.   Gastrointestinal: Negative.   Genitourinary: Negative.   Musculoskeletal: Negative.   Skin: Positive for wound (cellullitis).  Neurological: Negative.        Objective:   Physical Exam  Vitals reviewed. Constitutional: She is oriented to person, place, and time. She appears well-developed and well-nourished. She appears ill. No distress.  HENT:  Head: Normocephalic and atraumatic.  Eyes: Conjunctivae are normal. No scleral icterus.  Cardiovascular: S1 normal and S2 normal.  Tachycardia present.   Pulmonary/Chest: Effort normal and breath sounds normal. She has no wheezes. She has no rales.  Neurological: She is alert and oriented to person, place, and time.  Skin: Skin is warm and dry.     Approx 8cm erythema, induration and warmth of the right buttock; appears to be spontaneously draining  Psychiatric: She has a normal mood and affect. Her behavior is normal.     Filed Vitals:   11/11/12 1854  BP: 127/72  Pulse: 109  Temp: 101.5 F (38.6 C)  Resp: 20    Results for orders placed in visit on 11/11/12  POCT CBC     Result Value Range   WBC 10.6 (*) 4.6 - 10.2 K/uL   Lymph, poc 1.9  0.6 - 3.4   POC LYMPH PERCENT 18.3  10 - 50 %L   MID (cbc) 0.6  0 - 0.9   POC MID % 5.9  0 - 12 %M   POC Granulocyte 8.0 (*) 2 - 6.9   Granulocyte percent 75.8  37 - 80 %G   RBC 4.62  4.04 - 5.48 M/uL   Hemoglobin 12.7  12.2 - 16.2 g/dL   HCT, POC 40.9  81.1 - 47.9 %   MCV 85.4  80 - 97 fL   MCH, POC 27.5  27 - 31.2 pg   MCHC 32.2  31.8 - 35.4 g/dL   RDW, POC 91.4     Platelet Count, POC 272  142 - 424 K/uL   MPV 8.6  0 - 99.8 fL  GLUCOSE, POCT (MANUAL RESULT ENTRY)      Result Value Range   POC Glucose 303 (*) 70 - 99 mg/dl  POCT GLYCOSYLATED HEMOGLOBIN (HGB A1C)      Result Value Range   Hemoglobin A1C 13.0       Procedure Note: Verbal consent obtained.  Local anesthesia with 6 cc 2% lidocaine.  Betadine prep.  Incision with 11 blade.  Copious purulence expressed.  Wound irrigated with remaining anesthetic.  Packed with 1/4 inch plain packing.  Cleansed and dressed.  Discussed wound care.     Assessment & Plan:  Cellulitis and abscess of buttock - Plan: POCT CBC, POCT glucose (manual entry), POCT glycosylated hemoglobin (Hb A1C), Wound culture, sulfamethoxazole-trimethoprim (SEPTRA DS) 800-160 MG per tablet, DISCONTINUED: sulfamethoxazole-trimethoprim (SEPTRA DS) 800-160 MG per tablet  -- Pt with large abscess of the right buttock.  The area was incised and drained.  Copious purulence drained from the wound.  Wound culture collected, though this may not reveal anything as pt started treatment with keflex.  The area was packed and dressed.  Will continue keflex.  Start TMP/SMX today for MRSA coverage.  CBC shows white count slightly elevated at 10.6.  Pt is febrile.  Discussed wound care with pt.  Will have her RTC tomorrow for recheck.    Fever, unspecified - Plan: sulfamethoxazole-trimethoprim (SEPTRA DS) 800-160 MG per tablet, DISCONTINUED: sulfamethoxazole-trimethoprim (SEPTRA DS) 800-160 MG per tablet  --  See above  Diabetes - Plan: metFORMIN (GLUCOPHAGE) 500 MG tablet  -- Pt's glucose today is 303.  A1C is 13.  Discussed with pt that is very important that we get her diabetes under control.  Will start metformin 500mg  BID.  Discussed with her that this is on the $4 list so should be affordable.  Discussed with her that we can help manage her diabetes.  Pt expressed understanding.

## 2012-11-12 ENCOUNTER — Ambulatory Visit (INDEPENDENT_AMBULATORY_CARE_PROVIDER_SITE_OTHER): Payer: Self-pay | Admitting: Physician Assistant

## 2012-11-12 VITALS — BP 126/72 | HR 93 | Temp 97.5°F | Resp 16

## 2012-11-12 DIAGNOSIS — E119 Type 2 diabetes mellitus without complications: Secondary | ICD-10-CM

## 2012-11-12 DIAGNOSIS — L03317 Cellulitis of buttock: Secondary | ICD-10-CM

## 2012-11-12 DIAGNOSIS — L0231 Cutaneous abscess of buttock: Secondary | ICD-10-CM

## 2012-11-12 NOTE — Patient Instructions (Addendum)
Por favor, regresar en 2 dias porque necesitamos examinar su absceso otra vez.  Y necesita regresar en 3 meses por su diabetes.  [facility] [employer] [facility]

## 2012-11-12 NOTE — Progress Notes (Signed)
  Subjective:    Patient ID: Gloria Roy, female    DOB: 03-27-1976, 37 y.o.   MRN: [id number]  HPI   Gloria Roy is a 37 yr old female here for recheck of abscess.  I initially saw the pt last evening when we drained a large abscess of her right buttock.  At that time she was febrile to 101.58F and in significant pain.  Today pt states she is feeling much better.  Denies pain.  The area continues to drain.  She continues to take the Keflex and started the Bactrim last night.  Denies any continued fevers.  Tolerating the abx well.  In addition, she states that she did start the metformin last night.     Review of Systems  Constitutional: Negative for fever and chills.  HENT: Negative.   Respiratory: Negative.   Cardiovascular: Negative.   Gastrointestinal: Negative for nausea, vomiting and diarrhea.  Musculoskeletal: Negative.   Skin: Positive for wound.  Neurological: Negative.        Objective:   Physical Exam  Vitals reviewed. Constitutional: She is oriented to person, place, and time. She appears well-developed and well-nourished. No distress.  HENT:  Head: Normocephalic and atraumatic.  Eyes: Conjunctivae are normal. No scleral icterus.  Neurological: She is alert and oriented to person, place, and time.  Skin: Skin is warm and dry.     Psychiatric: She has a normal mood and affect. Her behavior is normal.     Filed Vitals:   11/12/12 1342  BP: 126/72  Pulse: 93  Temp: 97.5 F (36.4 C)  Resp: 16     Wound Care: Packing removed, saturated with purulent/serosanguinous drainage.  Able to express a small amount of purulence from the area.  Irrigated with 5cc 1% plain lidocaine.  Repacked and dressing.      Assessment & Plan:  Cellulitis and abscess of buttock  --  Much improved from last evening.  No longer febrile.  Much less TTP.  Small amount of purulent drainage.  I repacked and dressed the area.  Will have pt return in 48 hours for recheck/repacking.   Continue keflex and bactrim.  Continue daily dressing changes.  Diabetes  -- Pt states that she has started metformin BID.  Encouraged her to continue with this so we can get the DM under control.  We will probably need to titrate her up to 1000mg  BID if she will tolerate this.  If we are not getting good enough control with metformin, may also have to consider insulin therapy as pt has no ins and cost of treatment may be prohibitive.

## 2012-11-15 LAB — WOUND CULTURE: Gram Stain: NONE SEEN

## 2013-12-18 ENCOUNTER — Ambulatory Visit: Payer: Self-pay | Admitting: Internal Medicine

## 2013-12-18 ENCOUNTER — Encounter: Payer: Self-pay | Admitting: Internal Medicine

## 2013-12-18 VITALS — BP 120/80 | HR 99 | Temp 99.2°F | Resp 16 | Ht 62.5 in | Wt 172.0 lb

## 2013-12-18 DIAGNOSIS — R51 Headache: Secondary | ICD-10-CM

## 2013-12-18 DIAGNOSIS — R82998 Other abnormal findings in urine: Secondary | ICD-10-CM

## 2013-12-18 DIAGNOSIS — R8281 Pyuria: Secondary | ICD-10-CM

## 2013-12-18 DIAGNOSIS — R110 Nausea: Secondary | ICD-10-CM

## 2013-12-18 DIAGNOSIS — L03317 Cellulitis of buttock: Secondary | ICD-10-CM

## 2013-12-18 DIAGNOSIS — L0231 Cutaneous abscess of buttock: Secondary | ICD-10-CM

## 2013-12-18 DIAGNOSIS — R1031 Right lower quadrant pain: Secondary | ICD-10-CM

## 2013-12-18 DIAGNOSIS — R509 Fever, unspecified: Secondary | ICD-10-CM

## 2013-12-18 LAB — POCT CBC
Granulocyte percent: 82.6 %G — AB (ref 37–80)
HEMATOCRIT: 41.4 % (ref 37.7–47.9)
Hemoglobin: 13.3 g/dL (ref 12.2–16.2)
LYMPH, POC: 1 (ref 0.6–3.4)
MCH: 27.9 pg (ref 27–31.2)
MCHC: 32.1 g/dL (ref 31.8–35.4)
MCV: 86.9 fL (ref 80–97)
MID (cbc): 0.5 (ref 0–0.9)
MPV: 8.8 fL (ref 0–99.8)
POC GRANULOCYTE: 7.3 — AB (ref 2–6.9)
POC LYMPH %: 11.3 % (ref 10–50)
POC MID %: 6.1 %M (ref 0–12)
Platelet Count, POC: 197 10*3/uL (ref 142–424)
RBC: 4.76 M/uL (ref 4.04–5.48)
RDW, POC: 14.1 %
WBC: 8.8 10*3/uL (ref 4.6–10.2)

## 2013-12-18 LAB — POCT URINALYSIS DIPSTICK
BILIRUBIN UA: NEGATIVE
Glucose, UA: 500
KETONES UA: 15
Leukocytes, UA: NEGATIVE
Nitrite, UA: NEGATIVE
PH UA: 7
PROTEIN UA: 100
SPEC GRAV UA: 1.015
Urobilinogen, UA: 0.2

## 2013-12-18 LAB — POCT UA - MICROSCOPIC ONLY
BACTERIA, U MICROSCOPIC: NEGATIVE
CRYSTALS, UR, HPF, POC: NEGATIVE
Casts, Ur, LPF, POC: NEGATIVE
Mucus, UA: NEGATIVE
Yeast, UA: NEGATIVE

## 2013-12-18 LAB — COMPREHENSIVE METABOLIC PANEL
ALK PHOS: 81 U/L (ref 39–117)
ALT: 12 U/L (ref 0–35)
AST: 11 U/L (ref 0–37)
Albumin: 4.1 g/dL (ref 3.5–5.2)
BILIRUBIN TOTAL: 0.7 mg/dL (ref 0.2–1.2)
BUN: 8 mg/dL (ref 6–23)
CO2: 25 mEq/L (ref 19–32)
CREATININE: 0.49 mg/dL — AB (ref 0.50–1.10)
Calcium: 9 mg/dL (ref 8.4–10.5)
Chloride: 95 mEq/L — ABNORMAL LOW (ref 96–112)
GLUCOSE: 290 mg/dL — AB (ref 70–99)
Potassium: 3.7 mEq/L (ref 3.5–5.3)
Sodium: 131 mEq/L — ABNORMAL LOW (ref 135–145)
Total Protein: 7.5 g/dL (ref 6.0–8.3)

## 2013-12-18 MED ORDER — SULFAMETHOXAZOLE-TRIMETHOPRIM 800-160 MG PO TABS: 1.0000 | ORAL_TABLET | Freq: Two times a day (BID) | ORAL | Status: DC

## 2013-12-20 LAB — URINE CULTURE: Colony Count: >=100000

## 2013-12-20 NOTE — Progress Notes (Signed)
Subjective:    Patient ID: Gloria Roy, female    DOB: 1976-02-09, 38 y.o.   MRN: [id number]  HPI  Chief Complaint  Patient presents with  . Generalized Body Aches    head, eyes, mouth pain since this morning   aching all over with chills No cough or cold symptoms Mild abdominal pain in the lower quadrants Some flank pain with movement No skin rash No headache Positive urinary frequency but no dysuria or urgency  Patient Active Problem List   Diagnosis Date Noted  . Diabetes 11/12/2012    metFORMIN (GLUCOPHAGE) 500 MG tablet, Take 1 tablet (500 mg total) by mouth 2 (two) times daily with a meal She admits that she still has not started and maintained this medication as noted in her last office visit in March   Review of Systems Noncontributory    Objective:   Physical Exam BP 120/80  Pulse 99  Temp(Src) 99.2 F (37.3 C) (Oral)  Resp 16  Ht 5' 2.5" (1.588 m)  Wt 172 lb (78.019 kg)  BMI 30.94 kg/m2  SpO2 100%  LMP 12/08/2013 HEENT clear Heart regular without murmur Lungs clear Abdomen benign with mild tenderness suprapubic and right lower quadrant tenderness but no rebound or guarding Buttock abscess has healed No CVA tenderness to percussion Neuro intact      POCT CBC      Result Value Ref Range   WBC 8.8  4.6 - 10.2 K/uL   Lymph, poc 1.0  0.6 - 3.4   POC LYMPH PERCENT 11.3  10 - 50 %L   MID (cbc) 0.5  0 - 0.9   POC MID % 6.1  0 - 12 %M   POC Granulocyte 7.3 (*) 2 - 6.9   Granulocyte percent 82.6 (*) 37 - 80 %G   RBC 4.76  4.04 - 5.48 M/uL   Hemoglobin 13.3  12.2 - 16.2 g/dL   HCT, POC 84.641.4  96.237.7 - 47.9 %   MCV 86.9  80 - 97 fL   MCH, POC 27.9  27 - 31.2 pg   MCHC 32.1  31.8 - 35.4 g/dL   RDW, POC 95.214.1     Platelet Count, POC 197  142 - 424 K/uL   MPV 8.8  0 - 99.8 fL  POCT UA - MICROSCOPIC ONLY      Result Value Ref Range   WBC, Ur, HPF, POC 3-23     RBC, urine, microscopic 1-2     Bacteria, U Microscopic neg     Mucus, UA neg     Epithelial cells, urine per micros 0-1     Crystals, Ur, HPF, POC neg     Casts, Ur, LPF, POC neg     Yeast, UA neg    POCT URINALYSIS DIPSTICK      Result Value Ref Range   Color, UA yellow     Clarity, UA clear     Glucose, UA 500     Bilirubin, UA neg     Ketones, UA 15     Spec Grav, UA 1.015     Blood, UA trace     pH, UA 7.0     Protein, UA 100     Urobilinogen, UA 0.2     Nitrite, UA neg     Leukocytes, UA Negative      Assessment & Plan:  RLQ abdominal pain - Headache(784.0) - Nausea  Pyuria  Fever   Uncontrolled adult onset diabetes due to  noncompliance   Plan -Culture urine -Start Septra -Metabolic profile -Urged to start metformin -Followup one to 2 weeks

## 2014-04-08 ENCOUNTER — Telehealth: Payer: Self-pay | Admitting: *Deleted

## 2014-04-08 NOTE — Telephone Encounter (Signed)
Left message in voice mail for patient  to come to walk-in clinic and follow up on diabetes/ HgAlc. Last test was 10/2012.

## 2015-06-08 ENCOUNTER — Ambulatory Visit (INDEPENDENT_AMBULATORY_CARE_PROVIDER_SITE_OTHER): Payer: Self-pay | Admitting: Emergency Medicine

## 2015-06-08 VITALS — BP 100/60 | HR 102 | Temp 98.3°F | Resp 20 | Ht 62.5 in | Wt 177.0 lb

## 2015-06-08 DIAGNOSIS — L02214 Cutaneous abscess of groin: Secondary | ICD-10-CM

## 2015-06-08 MED ORDER — CIPROFLOXACIN HCL 500 MG PO TABS: 500.0000 mg | ORAL_TABLET | Freq: Two times a day (BID) | ORAL | Status: AC

## 2015-06-08 MED ORDER — METRONIDAZOLE 500 MG PO TABS: 500.0000 mg | ORAL_TABLET | Freq: Two times a day (BID) | ORAL | Status: AC

## 2015-06-08 NOTE — Patient Instructions (Signed)
   Change dressing if dressing is soiled. Keep covered at all times except when showering. You may get wet in the shower but do not scrub.  Apply warm compresses/heating pad to facilitate drainage. Leave packing in place. Do not apply any ointments as this may delay healing. If an antibiotic was prescribed, take as directed until finished. Return in 48 hours for wound care.      

## 2015-06-08 NOTE — Progress Notes (Signed)
Subjective:  Patient ID: Gloria Roy, female    DOB: [birth year]  Age: 39 y.o. MRN: [id number]  CC: Abscess   HPI Gloria Roy presents  patient is a four-day history of a an abscess on her medial left buttock. The lesions located near her perineum but away for anus. She has no fever or chills. No GI symptoms. No GU symptoms no vaginal discharge or bleeding. She has no history of trauma  History Gloria Roy has a past medical history of Diabetes mellitus.   She has no past surgical history on file.   Her  family history is not on file.  She   reports that she has never smoked. She does not have any smokeless tobacco history on file. She reports that she does not drink alcohol or use illicit drugs.  Outpatient Prescriptions Prior to Visit  Medication Sig Dispense Refill  . metFORMIN (GLUCOPHAGE) 500 MG tablet Take 1 tablet (500 mg total) by mouth 2 (two) times daily with a meal. (Patient not taking: Reported on 06/08/2015) 180 tablet 3  . cephALEXin (KEFLEX) 250 MG capsule Take 1 capsule (250 mg total) by mouth 4 (four) times daily. 28 capsule 0  . sulfamethoxazole-trimethoprim (SEPTRA DS) 800-160 MG per tablet Take 1 tablet by mouth 2 (two) times daily. 20 tablet 0   No facility-administered medications prior to visit.    Social History   Social History  . Marital Status: Legally Separated    Spouse Name: N/A  . Number of Children: N/A  . Years of Education: N/A   Social History Main Topics  . Smoking status: Never Smoker   . Smokeless tobacco: None  . Alcohol Use: No  . Drug Use: No  . Sexual Activity: Not Asked   Other Topics Concern  . None   Social History Narrative     Review of Systems  Constitutional: Negative for fever, chills and appetite change.  HENT: Negative for congestion, ear pain, postnasal drip, sinus pressure and sore throat.   Eyes: Negative for pain and redness.  Respiratory: Negative for cough, shortness of breath and wheezing.     Cardiovascular: Negative for leg swelling.  Gastrointestinal: Negative for nausea, vomiting, abdominal pain, diarrhea, constipation and blood in stool.  Endocrine: Negative for polyuria.  Genitourinary: Negative for dysuria, urgency, frequency and flank pain.  Musculoskeletal: Negative for gait problem.  Skin: Negative for rash.  Neurological: Negative for weakness and headaches.  Psychiatric/Behavioral: Negative for confusion and decreased concentration. The patient is not nervous/anxious.     Objective:  BP 100/60 mmHg  Pulse 102  Temp(Src) 98.3 F (36.8 C) (Oral)  Resp 20  Ht 5' 2.5" (1.588 m)  Wt 177 lb (80.287 kg)  BMI 31.84 kg/m2  SpO2 99%  LMP 05/30/2015  Physical Exam  Constitutional: She is oriented to person, place, and time. She appears well-developed and well-nourished.  HENT:  Head: Normocephalic and atraumatic.  Eyes: Conjunctivae are normal. Pupils are equal, round, and reactive to light.  Pulmonary/Chest: Effort normal.  Musculoskeletal: She exhibits no edema.  Neurological: She is alert and oriented to person, place, and time.  Skin: Skin is dry. Rash noted. Rash is pustular.  Psychiatric: She has a normal mood and affect. Her behavior is normal. Thought content normal.      Assessment & Plan:   Gloria Roy was seen today for abscess.  Diagnoses and all orders for this visit:  Abscess, groin -     metroNIDAZOLE (FLAGYL) 500 MG tablet; Take 1  tablet (500 mg total) by mouth 2 (two) times daily with a meal. DO NOT CONSUME ALCOHOL WHILE TAKING THIS MEDICATION. -     ciprofloxacin (CIPRO) 500 MG tablet; Take 1 tablet (500 mg total) by mouth 2 (two) times daily.   I have discontinued Gloria Roy's cephALEXin and sulfamethoxazole-trimethoprim. I am also having her start on metroNIDAZOLE and ciprofloxacin. Additionally, I am having her maintain her metFORMIN.  Meds ordered this encounter  Medications  . metroNIDAZOLE (FLAGYL) 500 MG tablet    Sig: Take 1  tablet (500 mg total) by mouth 2 (two) times daily with a meal. DO NOT CONSUME ALCOHOL WHILE TAKING THIS MEDICATION.    Dispense:  14 tablet    Refill:  0    Order Specific Question:  Supervising Provider    Answer:  SHAW, EVA N [[id number]]  . ciprofloxacin (CIPRO) 500 MG tablet    Sig: Take 1 tablet (500 mg total) by mouth 2 (two) times daily.    Dispense:  14 tablet    Refill:  0    Order Specific Question:  Supervising Provider    Answer:  SHAW, EVA N [[id number]]    Appropriate red flag conditions were discussed with the patient as well as actions that should be taken.  Patient expressed his understanding.  Follow-up: No Follow-up on file.  Carmelina Dane, MD

## 2015-06-08 NOTE — Progress Notes (Signed)
Procedure: Verbal consent obtained. Skin was cleaned with alcohol and anesthetized with 4 cc 1% lido with epi. A 1 cm incision was made. A large amount of purulent material expressed. Wound cavity size 1.5-2 cm depth. Wound aggressively packed with 1/4 inch packing. Wound dressed and wound care discussed.

## 2015-06-10 ENCOUNTER — Ambulatory Visit (INDEPENDENT_AMBULATORY_CARE_PROVIDER_SITE_OTHER): Payer: Self-pay | Admitting: Physician Assistant

## 2015-06-10 VITALS — BP 122/74 | HR 76 | Temp 98.6°F | Resp 18 | Ht 63.0 in | Wt 172.0 lb

## 2015-06-10 DIAGNOSIS — Z09 Encounter for follow-up examination after completed treatment for conditions other than malignant neoplasm: Secondary | ICD-10-CM

## 2015-06-10 NOTE — Progress Notes (Signed)
[facility] [address], [place] [address] [phone]  [facility]  06/10/[facility]   Name:  Cipriano MileJuana Q Holshouser   DOB:  June 21, 1976   MRN:  [id number]  PCP:  No primary care provider on file.    History of Present Illness:  Cipriano MileJuana Q Kroft is a 39 y.o. female patient who [facility] right labia/groin abscess status post incision and drainage 2 days ago.  Patient is currently on ciprofloxacin.  States that she is having improvement of her pain and swelling.  She is changing the dressings.  No fever, nausea, or increased redness, pain, or swelling.   Patient Active Problem List   Diagnosis Date Noted  . Diabetes (HCC) 11/12/2012    Past Medical History  Diagnosis Date  . Diabetes mellitus     History reviewed. No pertinent past surgical history.  Social History  Substance Use Topics  . Smoking status: Never Smoker   . Smokeless tobacco: None  . Alcohol Use: No    History reviewed. No pertinent family history.  No Known Allergies  Medication list has been reviewed and updated.  Current Outpatient Prescriptions on File Prior to Visit  Medication Sig Dispense Refill  . ciprofloxacin (CIPRO) 500 MG tablet Take 1 tablet (500 mg total) by mouth 2 (two) times daily. 14 tablet 0  . metFORMIN (GLUCOPHAGE) 500 MG tablet Take 1 tablet (500 mg total) by mouth 2 (two) times daily with a meal. 180 tablet 3  . metroNIDAZOLE (FLAGYL) 500 MG tablet Take 1 tablet (500 mg total) by mouth 2 (two) times daily with a meal. DO NOT CONSUME ALCOHOL WHILE TAKING THIS MEDICATION. 14 tablet 0   No current facility-administered medications on file prior to visit.    ROS ROS otherwise unremarkable unless listed above.  Physical Examination: BP 122/74 mmHg  Pulse 76  Temp(Src) 98.6 F (37 C)  Resp 18  Ht 5\' 3"  (1.6 m)  Wt 172 lb (78.019 kg)  BMI 30.48 kg/m2  SpO2 99%  LMP 05/30/2015 Ideal Body Weight: Weight in (lb) to have BMI = 25:  140.8  Physical Exam  Constitutional: She is oriented to person, place, and time. She appears well-developed and well-nourished. No distress.  HENT:  Head: Normocephalic and atraumatic.  Right Ear: Tympanic membrane, external ear and ear canal normal.  Left Ear: Tympanic membrane, external ear and ear canal normal.  Nose: Mucosal edema and rhinorrhea present. Right sinus exhibits no maxillary sinus tenderness and no frontal sinus tenderness. Left sinus exhibits no maxillary sinus tenderness and no frontal sinus tenderness.  Mouth/Throat: No uvula swelling. No oropharyngeal exudate, posterior oropharyngeal edema or posterior oropharyngeal erythema.  Eyes: Conjunctivae and EOM are normal. Pupils are equal, round, and reactive to light.  Cardiovascular: Normal rate and regular rhythm.  Exam reveals no gallop, no distant heart sounds and no friction rub.   No murmur heard. Pulmonary/Chest: Effort normal. No respiratory distress. She has no decreased breath sounds. She has no wheezes. She has no rhonchi.  Lymphadenopathy:       Head (right side): No submandibular, no tonsillar, no preauricular and no posterior auricular adenopathy present.       Head (left side): No submandibular, no tonsillar, no preauricular and no posterior auricular adenopathy present.  Neurological: She is alert and oriented to person, place, and time.  Skin: Skin is warm and dry. She is not diaphoretic.  Purulent necrotic tissue along the 1cm incision site.  Psychiatric: She has a normal mood and affect. Her behavior is normal.   Verbal consent obtained.  Purulent/sanguinous fluid expressed with palpation.  Necrotic tissue excised.  Depth .5-.7cm.  Irrigated with saline solution without continued purulence.  Repacked today    Assessment and Plan: CATELYNN SPARGER is a 39 y.o. female who is here today for wound care.  This is healing.  Advised to return in 3 days for wound recheck.  Encounter for recheck of abscess  following incision and drainage  Trena Platt, PA-C [facility] 06/10/2015 12:51 PM

## 2015-06-11 ENCOUNTER — Encounter: Payer: Self-pay | Admitting: Physician Assistant

## 2015-06-11 NOTE — Progress Notes (Signed)
  Medical screening examination/treatment/procedure(s) were performed by non-physician practitioner and as supervising physician I was immediately available for consultation/collaboration.     

## 2015-06-13 ENCOUNTER — Encounter: Payer: Self-pay | Admitting: *Deleted

## 2015-06-13 ENCOUNTER — Ambulatory Visit (INDEPENDENT_AMBULATORY_CARE_PROVIDER_SITE_OTHER): Payer: Self-pay | Admitting: Physician Assistant

## 2015-06-13 VITALS — BP 100/70 | HR 74 | Temp 97.3°F | Resp 18 | Ht 64.0 in | Wt 175.6 lb

## 2015-06-13 DIAGNOSIS — L02214 Cutaneous abscess of groin: Secondary | ICD-10-CM

## 2015-06-13 NOTE — Progress Notes (Signed)
   Gloria MileJuana Q Roy  MRN: [id number] DOB: April 07, 1976  Subjective:  Pt presents to clinic for a wound recheck.  She is still having some pain and she wants the packing removed because she thinks that it is causing her pain.  She is taking the abx without problems.  She overall feels like things are getting better.  Patient Active Problem List   Diagnosis Date Noted  . Diabetes (HCC) 11/12/2012    Current Outpatient Prescriptions on File Prior to Visit  Medication Sig Dispense Refill  . ciprofloxacin (CIPRO) 500 MG tablet Take 1 tablet (500 mg total) by mouth 2 (two) times daily. 14 tablet 0  . metFORMIN (GLUCOPHAGE) 500 MG tablet Take 1 tablet (500 mg total) by mouth 2 (two) times daily with a meal. 180 tablet 3  . metroNIDAZOLE (FLAGYL) 500 MG tablet Take 1 tablet (500 mg total) by mouth 2 (two) times daily with a meal. DO NOT CONSUME ALCOHOL WHILE TAKING THIS MEDICATION. 14 tablet 0   No current facility-administered medications on file prior to visit.    No Known Allergies  Review of Systems  Constitutional: Negative for fever and chills.  Gastrointestinal: Negative for nausea.  Skin: Positive for wound.   Objective:  BP 100/70 mmHg  Pulse 74  Temp(Src) 97.3 F (36.3 C) (Oral)  Resp 18  Ht 5\' 4"  (1.626 m)  Wt 175 lb 9.6 oz (79.652 kg)  BMI 30.13 kg/m2  SpO2 99%  LMP 05/30/2015  Physical Exam  Constitutional: She is oriented to person, place, and time and well-developed, well-nourished, and in no distress.  HENT:  Head: Normocephalic and atraumatic.  Right Ear: Hearing and external ear normal.  Left Ear: Hearing and external ear normal.  Eyes: Conjunctivae are normal.  Neck: Normal range of motion.  Pulmonary/Chest: Effort normal.  Neurological: She is alert and oriented to person, place, and time. Gait normal.  Skin: Skin is warm and dry.  Left groin - no erythema - induration about 2x1 in - mild TTP - no packing in place - small open wound present but no  purulence is expressed  Psychiatric: Mood, memory, affect and judgment normal.  Vitals reviewed.   Assessment and Plan :  Abscess, groin   Continue and finish abx.  Continue warm compresses.  Benny LennertSarah Buzz Axel PA-C  [facility] 06/13/2015 6:31 PM

## 2019-12-25 ENCOUNTER — Emergency Department (HOSPITAL_COMMUNITY)
Admission: EM | Admit: 2019-12-25 | Discharge: 2019-12-25 | Disposition: A | Discharge status: Home / Self Care | Payer: Self-pay | Attending: Emergency Medicine | Admitting: Emergency Medicine

## 2019-12-25 ENCOUNTER — Encounter (HOSPITAL_COMMUNITY): Payer: Self-pay | Admitting: Emergency Medicine

## 2019-12-25 ENCOUNTER — Other Ambulatory Visit: Payer: Self-pay

## 2019-12-25 DIAGNOSIS — M542 Cervicalgia: Secondary | ICD-10-CM | POA: Insufficient documentation

## 2019-12-25 DIAGNOSIS — Z5321 Procedure and treatment not carried out due to patient leaving prior to being seen by health care provider: Secondary | ICD-10-CM | POA: Insufficient documentation

## 2019-12-25 DIAGNOSIS — M79642 Pain in left hand: Secondary | ICD-10-CM | POA: Insufficient documentation

## 2019-12-25 NOTE — ED Notes (Addendum)
Patient states that she would like to leave. She is tired from work and states that she can manage her symptoms at home. Discussed risks of leaving before evaluation. Patient states she understands the risks. Patient ambulatory out of department.
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
